# Patient Record
Sex: Female | Born: 1980 | Race: White | Hispanic: No | Marital: Single | Smoking: Never smoker
Health system: Southern US, Community
[De-identification: ages and names within clinical notes are randomized; demographics above are authoritative.]

## PROBLEM LIST (undated history)

## (undated) DIAGNOSIS — M069 Rheumatoid arthritis, unspecified: Secondary | ICD-10-CM

## (undated) HISTORY — PX: DILATION AND CURETTAGE OF UTERUS: SHX78

## (undated) HISTORY — PX: HAND SURGERY: SHX662

## (undated) HISTORY — DX: Rheumatoid arthritis, unspecified: M06.9

---

## 2007-01-11 ENCOUNTER — Inpatient Hospital Stay: Payer: Self-pay | Admitting: Obstetrics and Gynecology

## 2008-08-18 ENCOUNTER — Ambulatory Visit: Payer: Self-pay

## 2010-03-04 ENCOUNTER — Observation Stay: Payer: Self-pay

## 2010-05-15 ENCOUNTER — Inpatient Hospital Stay: Payer: Self-pay

## 2011-12-27 HISTORY — PX: ANKLE FRACTURE SURGERY: SHX122

## 2012-12-22 ENCOUNTER — Ambulatory Visit: Payer: Self-pay | Admitting: Orthopedic Surgery

## 2012-12-22 LAB — CBC WITH DIFFERENTIAL/PLATELET
Basophil #: 0.1 10*3/uL (ref 0.0–0.1)
Basophil %: 0.6 %
HGB: 12.4 g/dL (ref 12.0–16.0)
Lymphocyte %: 14.2 %
MCHC: 32.7 g/dL (ref 32.0–36.0)
Monocyte %: 7.1 %
Neutrophil #: 7.1 10*3/uL — ABNORMAL HIGH (ref 1.4–6.5)
Platelet: 176 10*3/uL (ref 150–440)
RBC: 4.32 10*6/uL (ref 3.80–5.20)
RDW: 14.6 % — ABNORMAL HIGH (ref 11.5–14.5)

## 2012-12-22 LAB — COMPREHENSIVE METABOLIC PANEL
Alkaline Phosphatase: 70 U/L (ref 50–136)
Calcium, Total: 8.5 mg/dL (ref 8.5–10.1)
Chloride: 108 mmol/L — ABNORMAL HIGH (ref 98–107)
Co2: 21 mmol/L (ref 21–32)
Creatinine: 0.91 mg/dL (ref 0.60–1.30)
EGFR (Non-African Amer.): >60
Osmolality: 276 (ref 275–301)
SGOT(AST): 27 U/L (ref 15–37)
SGPT (ALT): 22 U/L (ref 12–78)
Sodium: 138 mmol/L (ref 136–145)

## 2014-08-04 IMAGING — XA DG C-ARM 1-60 MIN
10 series · 10 of 10 positions shown · non-contrast
Comparison: none

REASON FOR EXAM: ORIF right ankle
COMMENTS:

PROCEDURE:     DXR - DXR C-ARM WITH 2 VIEWS RT ANKLE  - December 22, 2012  [DATE]
RESULT:     Patient is status post open reduction internal fixation of
bimalleolar fracture with near-anatomic alignment.

[Series 1: cont. · 1 of 1 slices shown (1 of 9)]
[im 1/1]
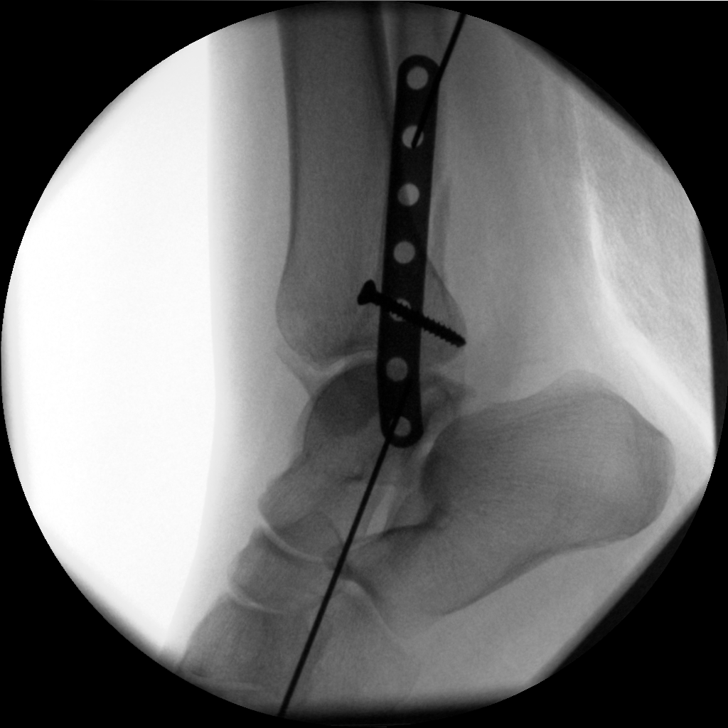

[Series 2: cont. · 1 of 1 slices shown (2 of 9)]
[im 1/1]
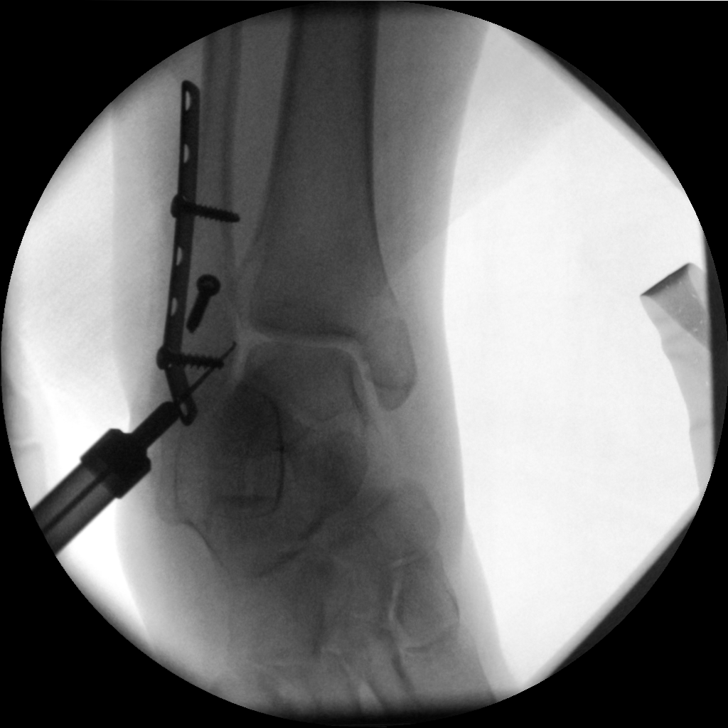

[Series 3: cont. · 1 of 1 slices shown (3 of 9)]
[im 1/1]
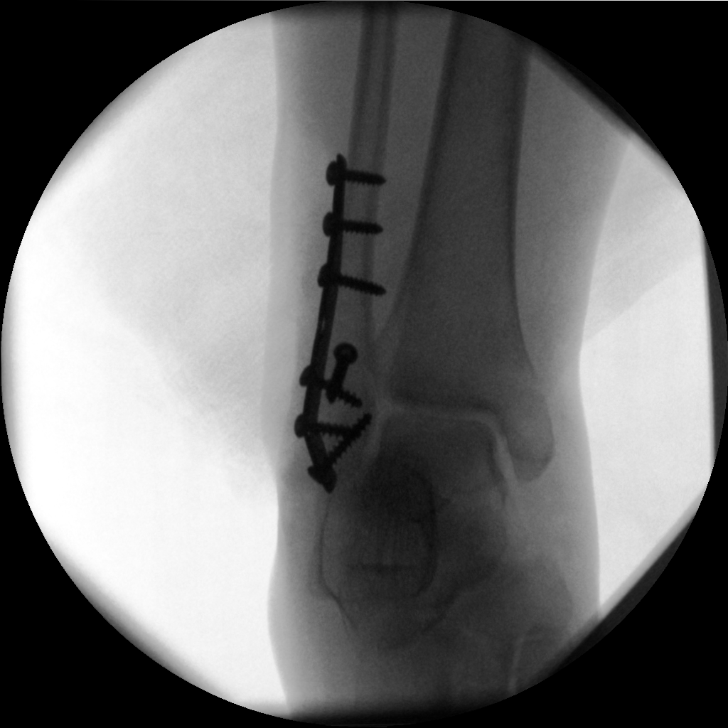

[Series 4: cont. · 1 of 1 slices shown (4 of 9)]
[im 1/1]
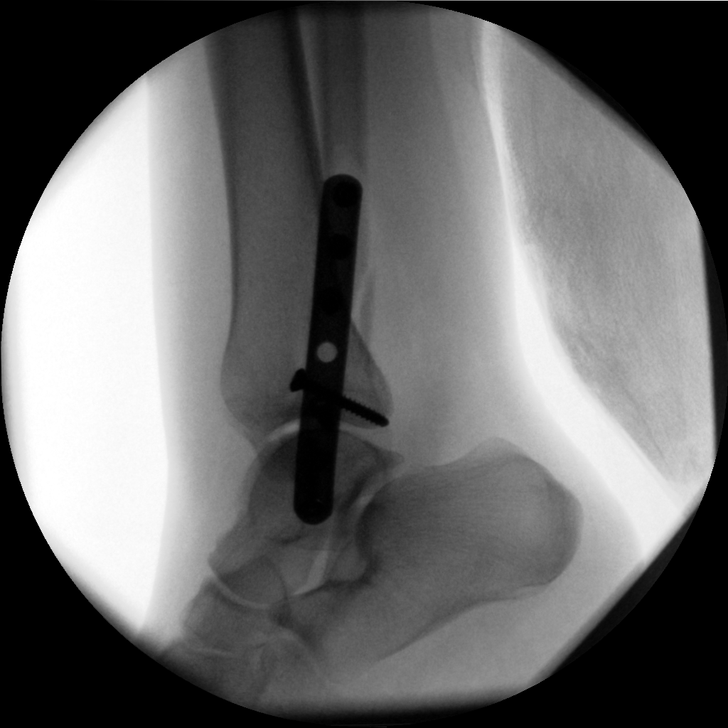

[Series 5: cont. · 1 of 1 slices shown (5 of 9)]
[im 1/1]
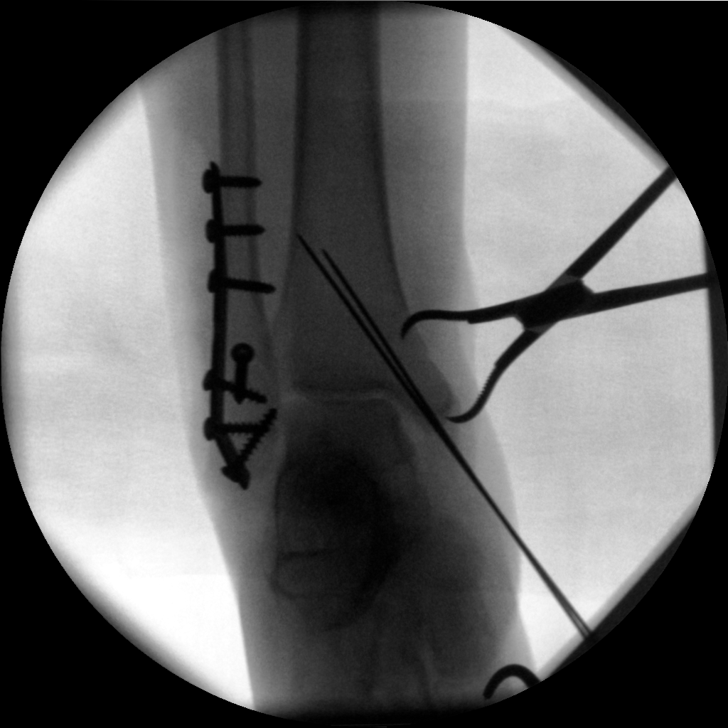

[Series 6: cont. · 1 of 1 slices shown (6 of 9)]
[im 1/1]
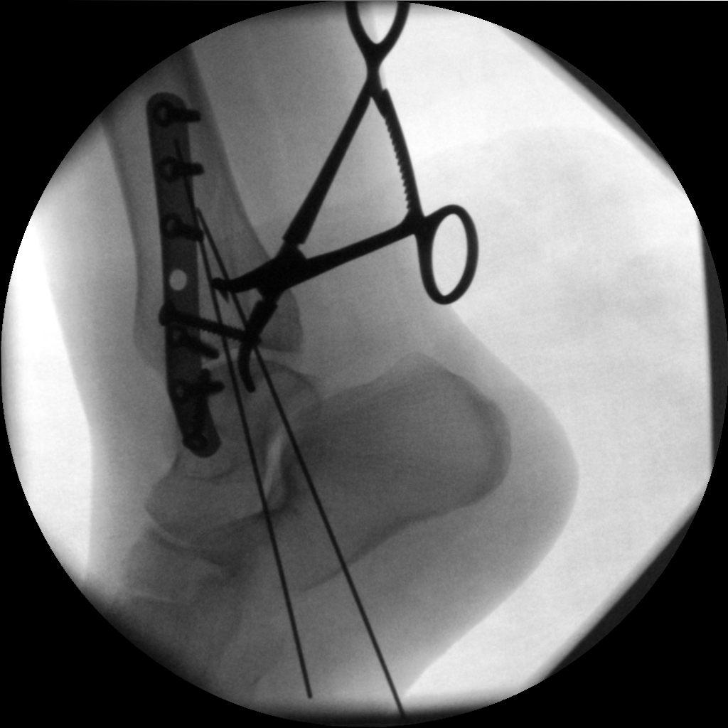

[Series 7: cont. · 1 of 1 slices shown (7 of 9)]
[im 1/1]
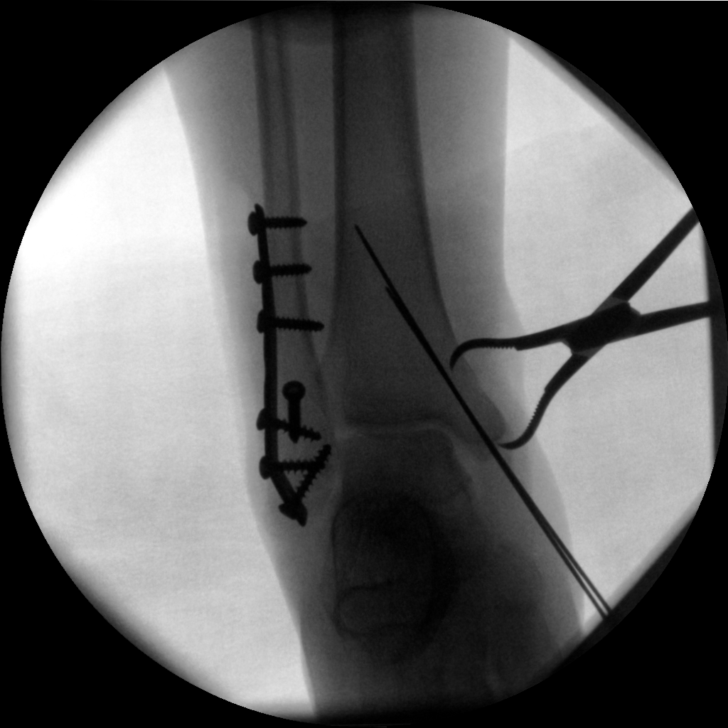

[Series 8: cont. · 1 of 1 slices shown (8 of 9)]
[im 1/1]
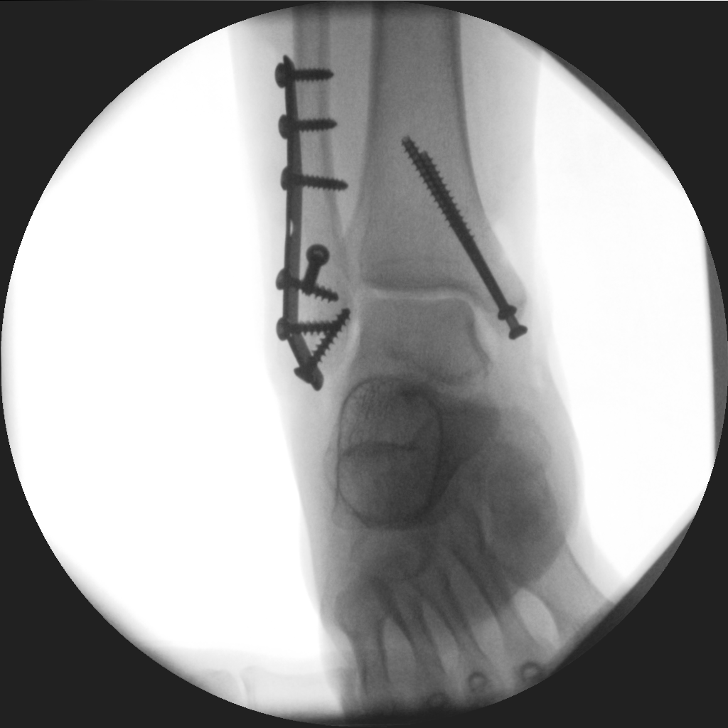

[Series 9: cont. · 1 of 1 slices shown (9 of 9)]
[im 1/1]
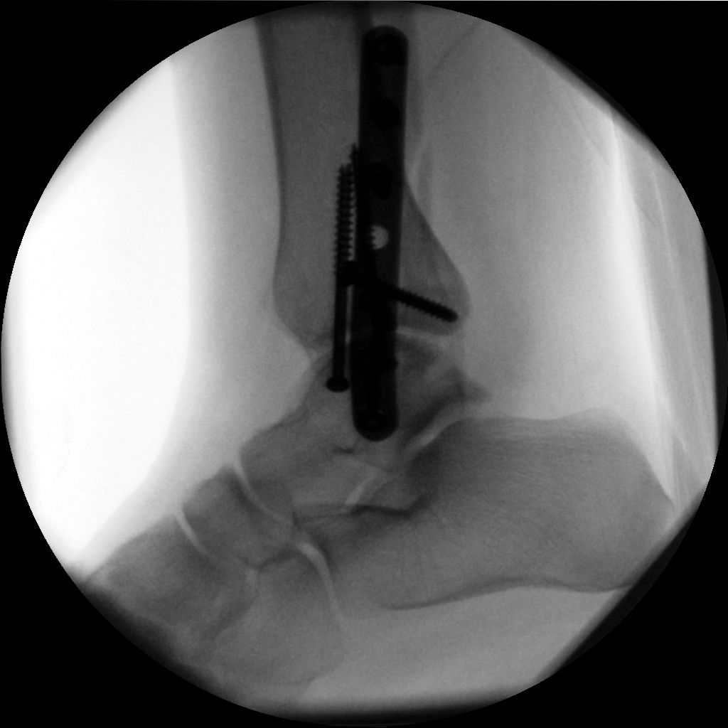

[Series 6001: ds 12-28 · 1 of 1 slices shown]
[im 1/1]
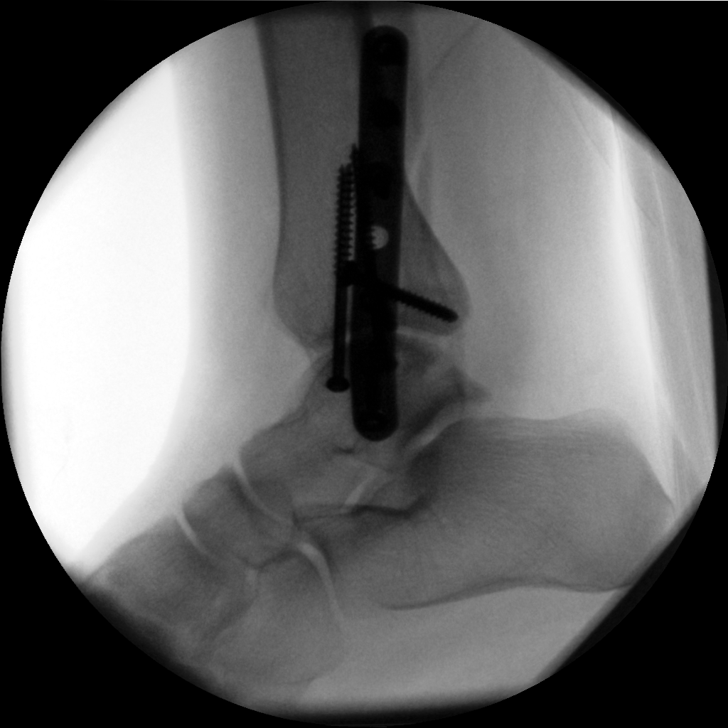

[10 of 10 positions shown; findings below may reference images not displayed]

IMPRESSION: Open reduction internal fixation as described above.

## 2015-04-14 NOTE — Op Note (Signed)
PATIENT NAME:  Natasha Adams, PEPLINSKI MR#:  [id number] DATE OF BIRTH:  March 21, 1981  DATE OF PROCEDURE:  12/22/2012  PREOPERATIVE DIAGNOSIS:  Right bimalleolar ankle fracture.  POSTOPERATIVE DIAGNOSIS:  Right bimalleolar ankle fracture.  PROCEDURE PERFORMED:  Open reduction/internal fixation, right ankle.  SURGEON:  Danelle Earthly, MD.  ANESTHESIA:  General.  FLUIDS ADMINISTERED:  1 liter crystalloid.  ESTIMATED BLOOD LOSS:  25 mL.  PREOPERATIVE ANTIBIOTICS:  1 gram Ancef.  COMPLICATIONS:  None.  DISPOSITION:  Stable to the PACU upon transport.  INDICATIONS FOR PROCEDURE:  This is a 34 year old female who was playing on a scooter with her children earlier today, when she was going down a hill, lost control and fell, sustaining a right ankle fracture.  The patient was counseled on the risks and benefits of the procedure, the risks to include, but not limited to, bleeding, infection, damage to nerves and/or vessels, malunion, nonunion, DVT and possible death.  The patient agreed to proceed with surgery after weighing these risks.  PROCEDURE IN DETAIL:  The patient was brought to the operating room and placed on the operating table with all extremities appropriately padded.  The right lower extremity thigh tourniquet was applied and a hip bump was placed to facilitate internal rotation of the leg.  The right lower extremity was then prepped and draped in sterile fashion.  A timeout was then performed with Anesthesia, nursing staff and surgeon of record to confirm surgical site, patient, medical record, date of birth, procedure to be performed, laterality, administration of antibiotics and confirmation that implants were in the room and available.  Next, a 6 inch Esmarch was used to exsanguinate the lower extremity and the tourniquet was inflated to 250 mmHg.  We began by making a lateral incision overlying the distal fibula, with dissection through hematoma, down to the level of the fibular bone, with  care not to damage the superficial peroneal nerve.  With the fracture site exposed, it was cleaned out with a combination of Freer, dental pick, as well as saline irrigation.  The fracture was reduced with a point-to-point reduction clamp and one 3.5 cortical screw was placed in lag technique to obtain compression across the fracture site.  There was a large thin, posterior cortical spike, but I felt this was too thin and tenuous to place a second lag screw, as the bone was not even strong enough to support a reduction clamp, so the decision was made to go with a single lag screw and then augment with a lateral neutralization plate.  A 7-hole 1/3 tubular plate was chosen and the distal end was bent to be more flush with the distal fibula.  The plate was secured proximally with three bicortical screws and distally with three unicortical cancellous screws.  AP and lateral fluoroscopic imaging was taken to confirm fracture reduction and placement of hardware.    Nest, we turned our attention to the medial malleolar fracture.  An incision was made directly over the medial malleolus, with again meticulous dissection through hematoma, down to the fracture site.  There was some medial cortical comminution noted on the distal medial malleolar fragment.  The fragment was keyed in place and held with a point-to-point reduction clamp.  Next, two threaded guidewires from the 4 mm cannulated screw set were then placed and position was confirmed fluoroscopically.  These then were over-drilled with the 2.7 cannulated drill bit and two partially threaded 50 mm 4.0 cannulated screws were then placed to secure the medial malleolus  fragment.  The guidewire was removed.    Final x-rays were taken to confirm overall fracture reduction and hardware placement.  Both wounds were copiously irrigated with sterile saline and then the tourniquet was deflated at 71 minutes.  After confirmation of maintenance of hemostasis, the wounds were  then sequentially closed with 2-0 Vicryl suture deep, followed by 3-0 Monocryl and interrupted 3-0 nylon sutures in the skin.  The patient was then placed in a sterile dressing, followed by a well-padded splint and transferred to PACU.    POSTOPERATIVE PLAN:  She will be discharged home and will remain strictly non-weightbearing.  She was advised to get up once an hour to go to the bathroom or get a glass of water, so she is at least mobile and decreasing the risk of a blood clot.  She was also counseled regarding signs and symptoms of blood clot, DVTs and the importance of seeing immediate medical attention should she develop significant leg pain, swelling or shortness of breath.  The patient will followup with Dr. Rosita KeaMenz for postoperative followup.  ____________________________ Danelle Earthlyobin T. Eckel, MD tte:eg D: 12/22/2012 20:30:43 ET T: 12/23/2012 20:34:39 ET JOB#: [id number]  cc: Danelle Earthlyobin T. Eckel, MD, <Dictator> Danelle EarthlyBIN T ECKEL MD ELECTRONICALLY SIGNED 12/24/2012 4:47

## 2015-04-14 NOTE — H&P (Signed)
    Subjective/Chief Complaint Right ankle pain    History of Present Illness 34 y/o female who fell this morning and noted immediate pain and inability to bear weight on her right ankle    Past Medical Health Non-Contributory   ALLERGIES:  NKA: None  Sulfa drugs: Unknown  Family and Social History:   Family History Non-Contributory    Social History negative tobacco, negative Illicit drugs    Place of Living Home   Review of Systems:   Subjective/Chief Complaint ankle pain    Fever/Chills No    Cough No    Sputum No    Abdominal Pain No    Diarrhea No    Constipation No    SOB/DOE No    Chest Pain No    Telemetry Reviewed NSR    Medications/Allergies Reviewed Medications/Allergies reviewed   Physical Exam:   GEN well developed, well nourished, no acute distress    NECK supple    RESP normal resp effort    CARD regular rate    EXTR Splint to RLE    NEURO motor/sensory function intact    PSYCH A+O to time, place, person     Assessment/Admission Diagnosis Bimalleolar ankle fracture    Plan ORIF Right ankle   Electronic Signatures: Danelle EarthlyEckel, Tobin T (MD)  (Signed 28-Dec-13 14:04)  Authored: CHIEF COMPLAINT and HISTORY, ALLERGIES, FAMILY AND SOCIAL HISTORY, REVIEW OF SYSTEMS, PHYSICAL EXAM, ASSESSMENT AND PLAN   Last Updated: 28-Dec-13 14:04 by Danelle EarthlyEckel, Tobin T (MD)

## 2015-04-14 NOTE — Consult Note (Signed)
PATIENT NAME:  Natasha Adams, Natasha Adams MR#:  [id number] DATE OF BIRTH:  Dec 20, 1981  DATE OF CONSULTATION:  12/22/2012  CONSULTING PHYSICIAN:  Danelle Earthlyobin T. Eckel, MD  HISTORY OF PRESENT ILLNESS: Natasha Adams is a 34 year old female who was playing on her child's scooter earlier today, was going down a hill, when she fell off and sustained an injury to her right ankle with immediate pain, swelling, and inability to bear weight.  She presented to the ER, where x-rays confirmed unstable bimalleolar ankle fracture.    PAST MEDICAL HISTORY: Significant for syndactyly, as well as Dupuytren's contractures.  Other medical history is noncontributory.    CURRENT MEDICATIONS: None.   ALLERGIES: To Sulfa drugs.    REVIEW OF SYSTEMS: Was negative for fevers, chills, headaches, chest pain, shortness of breath, nausea, vomiting, diarrhea, or constipation.    PHYSICAL EXAMINATION:  GENERAL: She is alert and oriented and in mild distress from her ankle pain.  MUSCULOSKELETAL: She has no pain about her hip or her knee.  She is able to move her toes and has sensation intact distally, as well as brisk capillary refill to all the digits on the right foot.  She does have a splint in place and her leg is elevated on a stack of bedsheets.  She has no tenderness to palpation about her left lower extremity or either upper extremity.    RADIOGRAPHIC STUDIES: Radiographs taken demonstrate bimalleolar ankle fracture.    ASSESSMENT: A 34 year old otherwise healthy female with an unstable ankle fracture.   PLAN: The patient will proceed to the operating room for open reduction internal fixation of her ankle fracture.  She will be placed in a well-padded splint and will be discharged home nonweightbearing.  She will be given pain medication, as well as medicine for constipation and nausea as well to go home with.  She will follow up with Dr. Rosita KeaMenz next week for routine postop check.    ____________________________ Danelle Earthlyobin T. Eckel,  MD tte:th D: 12/22/2012 14:18:05 ET T: 12/23/2012 16:59:21 ET JOB#: [id number]  cc: Danelle Earthlyobin T. Eckel, MD, <Dictator> Danelle EarthlyBIN T ECKEL MD ELECTRONICALLY SIGNED 12/23/2012 17:46

## 2015-11-11 ENCOUNTER — Other Ambulatory Visit (INDEPENDENT_AMBULATORY_CARE_PROVIDER_SITE_OTHER): Payer: BLUE CROSS/BLUE SHIELD | Admitting: *Deleted

## 2015-11-11 DIAGNOSIS — N912 Amenorrhea, unspecified: Secondary | ICD-10-CM

## 2015-11-11 DIAGNOSIS — Z3201 Encounter for pregnancy test, result positive: Secondary | ICD-10-CM

## 2015-11-11 LAB — POCT URINE PREGNANCY: Preg Test, Ur: POSITIVE — AB

## 2015-11-11 NOTE — Progress Notes (Signed)
Patient here today for a confirmation of pregnancy.  UPT positive today in office.  Bedside ultrasound today in office measures 4970w2d sac and yolk sac, no fetal pole.  This pregnancy is an unplanned pregnancy and patient is going to decide whether she is going to keep pregnancy or not.   Patient may return in 2-3 weeks when she decides what she is going to do.

## 2015-11-16 ENCOUNTER — Other Ambulatory Visit (INDEPENDENT_AMBULATORY_CARE_PROVIDER_SITE_OTHER): Payer: BLUE CROSS/BLUE SHIELD | Admitting: *Deleted

## 2015-11-16 ENCOUNTER — Telehealth: Payer: Self-pay | Admitting: *Deleted

## 2015-11-16 DIAGNOSIS — O26851 Spotting complicating pregnancy, first trimester: Secondary | ICD-10-CM

## 2015-11-16 DIAGNOSIS — O209 Hemorrhage in early pregnancy, unspecified: Secondary | ICD-10-CM

## 2015-11-16 NOTE — Telephone Encounter (Signed)
Pt is early pregnancy, approximately 6 wks, stated that she has started bleeding.  Will have pt come to office for BHCG.

## 2015-11-17 LAB — HCG, QUANTITATIVE, PREGNANCY: hCG, Beta Chain, Quant, S: 36773.9 m[IU]/mL — ABNORMAL HIGH

## 2015-11-18 ENCOUNTER — Other Ambulatory Visit (INDEPENDENT_AMBULATORY_CARE_PROVIDER_SITE_OTHER): Payer: BLUE CROSS/BLUE SHIELD | Admitting: *Deleted

## 2015-11-18 DIAGNOSIS — O200 Threatened abortion: Secondary | ICD-10-CM

## 2015-11-19 LAB — HCG, QUANTITATIVE, PREGNANCY: hCG, Beta Chain, Quant, S: 57976.2 m[IU]/mL — ABNORMAL HIGH

## 2015-11-23 ENCOUNTER — Other Ambulatory Visit (INDEPENDENT_AMBULATORY_CARE_PROVIDER_SITE_OTHER): Payer: BLUE CROSS/BLUE SHIELD | Admitting: *Deleted

## 2015-11-23 DIAGNOSIS — Z3201 Encounter for pregnancy test, result positive: Secondary | ICD-10-CM

## 2015-11-23 NOTE — Progress Notes (Signed)
Patient was here today to get an ultrasound for viability.  Pregnancy confirmed and pt is aware.  Ultrasound showed viable fetus with heartbeat.  Patient discharged home.  Patient will make a decision about when to receive care for this pregnancy.

## 2015-11-30 ENCOUNTER — Encounter: Payer: BLUE CROSS/BLUE SHIELD | Admitting: Family Medicine

## 2015-12-27 HISTORY — PX: BREAST ENHANCEMENT SURGERY: SHX7

## 2016-01-25 ENCOUNTER — Other Ambulatory Visit (INDEPENDENT_AMBULATORY_CARE_PROVIDER_SITE_OTHER): Payer: BLUE CROSS/BLUE SHIELD | Admitting: *Deleted

## 2016-01-25 DIAGNOSIS — Z3201 Encounter for pregnancy test, result positive: Secondary | ICD-10-CM

## 2016-01-25 NOTE — Progress Notes (Signed)
Patient here for a beta hcg today.  Took a HPT and got a positive result.

## 2016-01-26 LAB — HCG, QUANTITATIVE, PREGNANCY: HCG, BETA CHAIN, QUANT, S: 42.9 m[IU]/mL — AB

## 2016-01-28 ENCOUNTER — Other Ambulatory Visit (INDEPENDENT_AMBULATORY_CARE_PROVIDER_SITE_OTHER): Payer: BLUE CROSS/BLUE SHIELD | Admitting: *Deleted

## 2016-01-28 DIAGNOSIS — N912 Amenorrhea, unspecified: Secondary | ICD-10-CM

## 2016-01-28 NOTE — Progress Notes (Signed)
Patient here today for a Beta HCG.  

## 2016-01-29 LAB — HCG, QUANTITATIVE, PREGNANCY: hCG, Beta Chain, Quant, S: 31.4 m[IU]/mL — ABNORMAL HIGH

## 2016-02-05 ENCOUNTER — Other Ambulatory Visit (INDEPENDENT_AMBULATORY_CARE_PROVIDER_SITE_OTHER): Payer: BLUE CROSS/BLUE SHIELD | Admitting: *Deleted

## 2016-02-05 DIAGNOSIS — Z3201 Encounter for pregnancy test, result positive: Secondary | ICD-10-CM

## 2016-02-05 LAB — HCG, QUANTITATIVE, PREGNANCY: HCG, BETA CHAIN, QUANT, S: 18.7 m[IU]/mL — AB

## 2016-02-05 NOTE — Progress Notes (Signed)
Patient here today for a beta hcg.

## 2016-02-19 ENCOUNTER — Telehealth: Payer: Self-pay | Admitting: *Deleted

## 2016-02-19 DIAGNOSIS — B379 Candidiasis, unspecified: Secondary | ICD-10-CM

## 2016-02-19 MED ORDER — FLUCONAZOLE 150 MG PO TABS: 150.0000 mg | ORAL_TABLET | Freq: Once | ORAL | Status: DC

## 2016-02-19 NOTE — Telephone Encounter (Signed)
Pt called c/o symptoms persistent with a yeast infection, has tried OTC medication with little relief.  Sent Diflucan to pharmacy per protocol.  Instructed pt on medication use.

## 2016-03-01 ENCOUNTER — Other Ambulatory Visit (INDEPENDENT_AMBULATORY_CARE_PROVIDER_SITE_OTHER): Payer: BLUE CROSS/BLUE SHIELD

## 2016-03-01 DIAGNOSIS — O039 Complete or unspecified spontaneous abortion without complication: Secondary | ICD-10-CM

## 2016-03-01 LAB — HCG, QUANTITATIVE, PREGNANCY: hCG, Beta Chain, Quant, S: <2 m[IU]/mL

## 2016-03-01 NOTE — Progress Notes (Signed)
Patient presents for follow up HCG. Armandina StammerJennifer Cherelle Midkiff RN BSN

## 2016-03-09 ENCOUNTER — Telehealth: Payer: Self-pay | Admitting: *Deleted

## 2016-03-09 NOTE — Telephone Encounter (Signed)
Telephone call to patient regarding hcg results.  Notified patient of level from 03/01/16 .  Informed patient she did not need to return for further blood draws.

## 2016-10-11 ENCOUNTER — Ambulatory Visit (INDEPENDENT_AMBULATORY_CARE_PROVIDER_SITE_OTHER): Payer: BLUE CROSS/BLUE SHIELD | Admitting: Family Medicine

## 2016-10-11 ENCOUNTER — Encounter: Payer: Self-pay | Admitting: Family Medicine

## 2016-10-11 VITALS — BP 101/68 | Resp 18 | Ht 61.5 in | Wt 129.0 lb

## 2016-10-11 DIAGNOSIS — Z3046 Encounter for surveillance of implantable subdermal contraceptive: Secondary | ICD-10-CM | POA: Diagnosis not present

## 2016-10-11 DIAGNOSIS — Z30430 Encounter for insertion of intrauterine contraceptive device: Secondary | ICD-10-CM | POA: Diagnosis not present

## 2016-10-11 MED ORDER — LEVONORGESTREL 20 MCG/24HR IU IUD
INTRAUTERINE_SYSTEM | Freq: Once | INTRAUTERINE | Status: AC
  Administered 2016-10-11: 16:00:00 via INTRAUTERINE

## 2016-10-11 NOTE — Progress Notes (Signed)
   Subjective:    Patient ID: Natasha Adams is a 35 y.o. female presenting with No chief complaint on file.  on 10/11/2016  HPI: Has had Nexplanon for 1 year. Continues to have abnormal bleeding. Would like to have it removed and IUD placed for contraception.  Review of Systems  Skin: Negative for rash.      Objective:    There were no vitals taken for this visit. Physical Exam  Constitutional: She appears well-developed and well-nourished. No distress.  HENT:  Head: Normocephalic and atraumatic.  Neck: Neck supple.  Pulmonary/Chest: Effort normal.   Procedure: Patient given informed consent for removal of her Nexplanon, time out was performed.  Signed copy in the chart.  Appropriate time out taken. Nexplanon site identified.  Area prepped in usual sterile fashon. One cc of 1% lidocaine was used to anesthetize the area at the distal end of the implant. A small stab incision was made right beside the implant on the distal portion.  The Nexplanon rod was grasped using hemostats and removed without difficulty.  There was less than 3 cc blood loss. There were no complications.  A small amount of antibiotic ointment and steri-strips were applied over the small incision.  A pressure bandage was applied to reduce any bruising.  The patient tolerated the procedure well and was given post procedure instructions. Patient identified, informed consent performed, signed copy in chart, time out was performed.  Urine pregnancy test negative.  Procedure: Speculum placed in the vagina.  Cervix visualized.  Cleaned with Betadine x 2.  Grasped anteriourly with a single tooth tenaculum.  Uterus sounded to 6 cm.  Mirena IUD placed per manufacturer's recommendations.  Strings trimmed to 3 cm.   Patient given post procedure instructions and Mirena care card with expiration date.  Patient is asked to check IUD strings periodically and follow up in 4-6 weeks for IUD check.      Assessment & Plan:    Encounter for IUD insertion  Nexplanon removal   Return in about 4 weeks (around 11/08/2016) for iud check.  Reva Boresanya S Giovonnie Trettel 10/11/2016 3:20 PM

## 2016-10-11 NOTE — Patient Instructions (Signed)
Levonorgestrel intrauterine device (IUD) What is this medicine? LEVONORGESTREL IUD (LEE voe nor jes trel) is a contraceptive (birth control) device. The device is placed inside the uterus by a healthcare professional. It is used to prevent pregnancy and can also be used to treat heavy bleeding that occurs during your period. Depending on the device, it can be used for 3 to 5 years. This medicine may be used for other purposes; ask your health care provider or pharmacist if you have questions. What should I tell my health care provider before I take this medicine? They need to know if you have any of these conditions: -abnormal Pap smear -cancer of the breast, uterus, or cervix -diabetes -endometritis -genital or pelvic infection now or in the past -have more than one sexual partner or your partner has more than one partner -heart disease -history of an ectopic or tubal pregnancy -immune system problems -IUD in place -liver disease or tumor -problems with blood clots or take blood-thinners -use intravenous drugs -uterus of unusual shape -vaginal bleeding that has not been explained -an unusual or allergic reaction to levonorgestrel, other hormones, silicone, or polyethylene, medicines, foods, dyes, or preservatives -pregnant or trying to get pregnant -breast-feeding How should I use this medicine? This device is placed inside the uterus by a health care professional. Talk to your pediatrician regarding the use of this medicine in children. Special care may be needed. Overdosage: If you think you have taken too much of this medicine contact a poison control center or emergency room at once. NOTE: This medicine is only for you. Do not share this medicine with others. What if I miss a dose? This does not apply. What may interact with this medicine? Do not take this medicine with any of the following medications: -amprenavir -bosentan -fosamprenavir This medicine may also interact with  the following medications: -aprepitant -barbiturate medicines for inducing sleep or treating seizures -bexarotene -griseofulvin -medicines to treat seizures like carbamazepine, ethotoin, felbamate, oxcarbazepine, phenytoin, topiramate -modafinil -pioglitazone -rifabutin -rifampin -rifapentine -some medicines to treat HIV infection like atazanavir, indinavir, lopinavir, nelfinavir, tipranavir, ritonavir -St. John's wort -warfarin This list may not describe all possible interactions. Give your health care provider a list of all the medicines, herbs, non-prescription drugs, or dietary supplements you use. Also tell them if you smoke, drink alcohol, or use illegal drugs. Some items may interact with your medicine. What should I watch for while using this medicine? Visit your doctor or health care professional for regular check ups. See your doctor if you or your partner has sexual contact with others, becomes HIV positive, or gets a sexual transmitted disease. This product does not protect you against HIV infection (AIDS) or other sexually transmitted diseases. You can check the placement of the IUD yourself by reaching up to the top of your vagina with clean fingers to feel the threads. Do not pull on the threads. It is a good habit to check placement after each menstrual period. Call your doctor right away if you feel more of the IUD than just the threads or if you cannot feel the threads at all. The IUD may come out by itself. You may become pregnant if the device comes out. If you notice that the IUD has come out use a backup birth control method like condoms and call your health care provider. Using tampons will not change the position of the IUD and are okay to use during your period. What side effects may I notice from receiving this medicine?   Side effects that you should report to your doctor or health care professional as soon as possible: -allergic reactions like skin rash, itching or  hives, swelling of the face, lips, or tongue -fever, flu-like symptoms -genital sores -high blood pressure -no menstrual period for 6 weeks during use -pain, swelling, warmth in the leg -pelvic pain or tenderness -severe or sudden headache -signs of pregnancy -stomach cramping -sudden shortness of breath -trouble with balance, talking, or walking -unusual vaginal bleeding, discharge -yellowing of the eyes or skin Side effects that usually do not require medical attention (report to your doctor or health care professional if they continue or are bothersome): -acne -breast pain -change in sex drive or performance -changes in weight -cramping, dizziness, or faintness while the device is being inserted -headache -irregular menstrual bleeding within first 3 to 6 months of use -nausea This list may not describe all possible side effects. Call your doctor for medical advice about side effects. You may report side effects to FDA at 1-800-FDA-1088. Where should I keep my medicine? This does not apply. NOTE: This sheet is a summary. It may not cover all possible information. If you have questions about this medicine, talk to your doctor, pharmacist, or health care provider.    2016, [employer]. (2012-01-12 13:54:04)  

## 2016-10-17 ENCOUNTER — Encounter: Payer: Self-pay | Admitting: *Deleted

## 2018-04-04 ENCOUNTER — Encounter: Payer: Self-pay | Admitting: Nurse Practitioner

## 2018-04-04 ENCOUNTER — Ambulatory Visit (INDEPENDENT_AMBULATORY_CARE_PROVIDER_SITE_OTHER): Payer: 59 | Admitting: Nurse Practitioner

## 2018-04-04 VITALS — BP 92/65 | HR 82 | Ht 61.5 in | Wt 129.2 lb

## 2018-04-04 DIAGNOSIS — Z113 Encounter for screening for infections with a predominantly sexual mode of transmission: Secondary | ICD-10-CM

## 2018-04-04 DIAGNOSIS — Z23 Encounter for immunization: Secondary | ICD-10-CM | POA: Diagnosis not present

## 2018-04-04 DIAGNOSIS — Z01419 Encounter for gynecological examination (general) (routine) without abnormal findings: Secondary | ICD-10-CM

## 2018-04-04 DIAGNOSIS — N898 Other specified noninflammatory disorders of vagina: Secondary | ICD-10-CM | POA: Diagnosis not present

## 2018-04-04 DIAGNOSIS — Z9882 Breast implant status: Secondary | ICD-10-CM | POA: Insufficient documentation

## 2018-04-04 DIAGNOSIS — Z01411 Encounter for gynecological examination (general) (routine) with abnormal findings: Secondary | ICD-10-CM | POA: Diagnosis not present

## 2018-04-04 DIAGNOSIS — Z30431 Encounter for routine checking of intrauterine contraceptive device: Secondary | ICD-10-CM | POA: Insufficient documentation

## 2018-04-04 HISTORY — DX: Breast implant status: Z98.82

## 2018-04-04 NOTE — Progress Notes (Signed)
GYNECOLOGY ANNUAL PREVENTATIVE CARE ENCOUNTER NOTE  Subjective:   Natasha Adams is a 37 y.o. [phone] female here for a routine annual gynecologic exam.  Current complaints increased discharge and vaginal itching.  Has to wear a panty liner daily.  Has been treated previously for BV but the last time was over a year ago.   Denies abnormal vaginal bleeding, pelvic pain, problems with intercourse or other gynecologic concerns.    Gynecologic History Patient's last menstrual period was 03/19/2018. Contraception: IUD Last Pap: several years ago. Results were: normal Last mammogram: not indicated yet. Had one 5 years ago.  Results were: normal  Obstetric History OB History  Gravida Para Term Preterm AB Living  3 2 2   1 2   SAB TAB Ectopic Multiple Live Births    1     2    # Outcome Date GA Lbr Len/2nd Weight Sex Delivery Anes PTL Lv  3 TAB 2017 4134w0d         2 Term 05/16/10 1278w0d  6 lb 12 oz (3.062 kg) F    LIV  1 Term 01/12/07 6178w0d  5 lb 4 oz (2.381 kg) F Vag-Spont   LIV    Past Medical History:  Diagnosis Date  . Rheumatoid arthritis [facility](HCC)     Past Surgical History:  Procedure Laterality Date  . ANKLE FRACTURE SURGERY Right 2013  . BREAST ENHANCEMENT SURGERY  2017  . DILATION AND CURETTAGE OF UTERUS    . HAND SURGERY      Current Outpatient Medications on File Prior to Visit  Medication Sig Dispense Refill  . levonorgestrel (MIRENA, 52 MG,) 20 MCG/24HR IUD by Intrauterine route.     No current facility-administered medications on file prior to visit.     Allergies  Allergen Reactions  . Ibuprofen Other (See Comments)    Exacerbates stomach ulcers.   . Sulfa Antibiotics Other (See Comments)    Rheumatoid arthritis    Social History   Socioeconomic History  . Marital status: Married    Spouse name: Not on file  . Number of children: Not on file  . Years of education: Not on file  . Highest education level: Not on file  Occupational History  . Not on file   Social Needs  . Financial resource strain: Not on file  . Food insecurity:    Worry: Not on file    Inability: Not on file  . Transportation needs:    Medical: Not on file    Non-medical: Not on file  Tobacco Use  . Smoking status: Never Smoker  . Smokeless tobacco: Never Used  Substance and Sexual Activity  . Alcohol use: Yes    Comment: weekends  . Drug use: No  . Sexual activity: Yes    Partners: Male    Birth control/protection: IUD  Lifestyle  . Physical activity:    Days per week: Not on file    Minutes per session: Not on file  . Stress: Not on file  Relationships  . Social connections:    Talks on phone: Not on file    Gets together: Not on file    Attends religious service: Not on file    Active member of club or organization: Not on file    Attends meetings of clubs or organizations: Not on file    Relationship status: Not on file  . Intimate partner violence:    Fear of current or ex partner: Not on file  Emotionally abused: Not on file    Physically abused: Not on file    Forced sexual activity: Not on file  Other Topics Concern  . Not on file  Social History Narrative  . Not on file    History reviewed. No pertinent family history.  The following portions of the patient's history were reviewed and updated as appropriate: allergies, current medications, past family history, past medical history, past social history, past surgical history and problem list.  Review of Systems Pertinent items noted in HPI and remainder of comprehensive ROS otherwise negative.   Objective:  BP 92/65   Pulse 82   Ht 5' 1.5" (1.562 m)   Wt 129 lb 3.2 oz (58.6 kg)   LMP 03/19/2018   BMI 24.02 kg/m  CONSTITUTIONAL: Well-developed, well-nourished female in no acute distress.  HENT:  Normocephalic, atraumatic, External right and left ear normal.  EYES: Conjunctivae and EOM are normal.No scleral icterus.  NECK: Normal range of motion, supple, no masses.  Normal  thyroid.  SKIN: Skin is warm and dry. No rash noted. Not diaphoretic. No erythema. No pallor. NEUROLOGIC: Alert and oriented to person, place, and time. Normal muscle tone coordination. No cranial nerve deficit noted. PSYCHIATRIC: Normal mood and affect. Normal behavior. Normal judgment and thought content. CARDIOVASCULAR: Normal heart rate noted, regular rhythm RESPIRATORY: Clear to auscultation bilaterally. Effort and breath sounds normal, no problems with respiration noted. BREASTS: Symmetric in size. Scars consistent with bilateral breast implants noted.  Implants palpated.  No masses, skin changes, nipple drainage, or lymphadenopathy. ABDOMEN: Soft, no distention noted.  No tenderness, rebound or guarding.  PELVIC: Normal appearing external genitalia; normal appearing vaginal mucosa and cervix.  Abnormal discharge noted - pooling in vagina - yellow cloudy no odor detected.  Pap smear obtained.  IUD strings seen and no part of IUD palpated on bimanual exam.   Normal uterine size, no other palpable masses, no uterine or adnexal tenderness.   MUSCULOSKELETAL: Normal range of motion. No tenderness.  No cyanosis, clubbing, or edema.     Assessment and Plan:  1. Vaginal discharge Increased amount noted on pelvic exam.  Testing being done today - has been ongoing - wears a minipad daily.  Will change and not use the Always brand as that brand may contribute to the itching. - Cervicovaginal ancillary only  2. Well woman exam  - Cytology - PAP - Tdap vaccine greater than or equal to 7yo IM -  given  3. Screening examination for STD (sexually transmitted disease)  - Hepatitis B surface antigen - Hepatitis C antibody - HIV antibody - RPR  4. Encounter for well woman exam with routine gynecological exam   5. Contraceptive, surveillance, intrauterine device IUD strings visualized and no part of IUD is not felt on bimanual exam Is pleased with IUD and will continue using   6. Encounter for  immunization  - Tdap vaccine greater than or equal to 7yo IM  Will follow up results of pap smear and manage accordingly. Mammogram not indicated - had one at age 58 and was to follow up again at age 22. Routine preventative health maintenance measures emphasized. Please refer to After Visit Summary for other counseling recommendations.    Nolene Bernheim, RN, MSN, NP-BC Nurse Practitioner, [facility] for [facility]

## 2018-04-05 LAB — RPR: RPR: NONREACTIVE

## 2018-04-05 LAB — HIV ANTIBODY (ROUTINE TESTING W REFLEX): HIV Screen 4th Generation wRfx: NONREACTIVE

## 2018-04-05 LAB — HEPATITIS C ANTIBODY: Hep C Virus Ab: <0.1 s/co ratio (ref 0.0–0.9)

## 2018-04-05 LAB — CERVICOVAGINAL ANCILLARY ONLY
Bacterial vaginitis: POSITIVE — AB
Candida vaginitis: NEGATIVE

## 2018-04-05 LAB — HEPATITIS B SURFACE ANTIGEN: Hepatitis B Surface Ag: NEGATIVE

## 2018-04-06 ENCOUNTER — Encounter (INDEPENDENT_AMBULATORY_CARE_PROVIDER_SITE_OTHER): Payer: Self-pay

## 2018-04-06 ENCOUNTER — Other Ambulatory Visit: Payer: Self-pay | Admitting: Nurse Practitioner

## 2018-04-06 LAB — CYTOLOGY - PAP
DIAGNOSIS: NEGATIVE
HPV: NOT DETECTED

## 2018-04-06 MED ORDER — FLUCONAZOLE 150 MG PO TABS: 150.0000 mg | ORAL_TABLET | Freq: Every day | ORAL | 0 refills | Status: DC

## 2018-04-06 MED ORDER — TINIDAZOLE 500 MG PO TABS: 2.0000 g | ORAL_TABLET | Freq: Every day | ORAL | 0 refills | Status: AC

## 2018-04-06 NOTE — Progress Notes (Signed)
Lab results reviewed.  Bacterial vaginosis present and consistent with physical exam findings.  Sent MyChart note to client.  Prescribed Tinidazole 2 gm daily x 2 days.  Client did not think that medtronidazole worked for her in the past and sent one diflucan tablet as she stated that she often gets a yeast infection afterwards.  Natasha BernheimERRI Ladonna Vanorder, RN, MSN, NP-BC Nurse Practitioner, [facility], [facility] 04/06/2018 12:47 PM

## 2018-05-28 ENCOUNTER — Encounter: Payer: Self-pay | Admitting: Nurse Practitioner

## 2019-12-05 ENCOUNTER — Other Ambulatory Visit: Payer: Self-pay

## 2019-12-05 DIAGNOSIS — Z20822 Contact with and (suspected) exposure to covid-19: Secondary | ICD-10-CM

## 2019-12-07 LAB — NOVEL CORONAVIRUS, NAA: SARS-CoV-2, NAA: NOT DETECTED

## 2019-12-11 ENCOUNTER — Other Ambulatory Visit: Payer: Self-pay

## 2019-12-11 ENCOUNTER — Ambulatory Visit: Payer: 59 | Attending: Internal Medicine

## 2019-12-11 DIAGNOSIS — Z20822 Contact with and (suspected) exposure to covid-19: Secondary | ICD-10-CM

## 2019-12-13 LAB — NOVEL CORONAVIRUS, NAA: SARS-CoV-2, NAA: NOT DETECTED

## 2019-12-16 ENCOUNTER — Ambulatory Visit: Payer: 59 | Attending: Internal Medicine

## 2019-12-16 DIAGNOSIS — Z20822 Contact with and (suspected) exposure to covid-19: Secondary | ICD-10-CM

## 2019-12-17 LAB — NOVEL CORONAVIRUS, NAA: SARS-CoV-2, NAA: NOT DETECTED

## 2019-12-18 ENCOUNTER — Other Ambulatory Visit: Payer: 59

## 2020-06-24 ENCOUNTER — Other Ambulatory Visit (HOSPITAL_COMMUNITY)
Admission: RE | Admit: 2020-06-24 | Discharge: 2020-06-24 | Disposition: A | Discharge status: Home / Self Care | Payer: 59 | Source: Ambulatory Visit | Attending: Obstetrics & Gynecology | Admitting: Obstetrics & Gynecology

## 2020-06-24 ENCOUNTER — Other Ambulatory Visit: Payer: Self-pay

## 2020-06-24 ENCOUNTER — Ambulatory Visit (INDEPENDENT_AMBULATORY_CARE_PROVIDER_SITE_OTHER): Payer: 59

## 2020-06-24 VITALS — BP 111/76 | HR 82

## 2020-06-24 DIAGNOSIS — N898 Other specified noninflammatory disorders of vagina: Secondary | ICD-10-CM | POA: Diagnosis present

## 2020-06-24 NOTE — Progress Notes (Signed)
SUBJECTIVE:  39 y.o. female complains of  vaginal discharge for a couple of days. Denies abnormal vaginal bleeding or significant pelvic pain or fever. No UTI symptoms. Denies history of known exposure to STD.  No LMP recorded. (Menstrual status: IUD).  OBJECTIVE:  She appears well, afebrile. Urine dipstick: not done at this time  ASSESSMENT:  Vaginal Discharge small amount  Vaginal Odor: no    PLAN:   BVAG, CVAG probe sent to lab. Treatment: To be determined once lab results are received ROV prn if symptoms persist or worsen.

## 2020-06-24 NOTE — Progress Notes (Signed)
Patient was assessed and managed by nursing staff during this encounter. I have reviewed the chart and agree with the documentation and plan. I have also made any necessary editorial changes.  Jaynie Collins, MD 06/24/2020 8:27 AM

## 2020-06-24 NOTE — Progress Notes (Signed)
SUBJECTIVE:  39 y.o. female complains of vaginal discharge for a couple of days. Denies abnormal vaginal bleeding or significant pelvic pain or fever. No UTI symptoms. Denies history of known exposure to STD.  No LMP recorded. (Menstrual status: IUD).  OBJECTIVE:  She appears well, afebrile. Urine dipstick: not done in the office   ASSESSMENT:  Vaginal Discharge: small amount  Vaginal Odor; none   PLAN:   BVAG, CVAG probe sent to lab. Treatment: To be determined once lab results are received ROV prn if symptoms persist or worsen.

## 2020-06-25 ENCOUNTER — Other Ambulatory Visit: Payer: Self-pay | Admitting: Obstetrics & Gynecology

## 2020-06-25 ENCOUNTER — Telehealth: Payer: Self-pay

## 2020-06-25 DIAGNOSIS — N760 Acute vaginitis: Secondary | ICD-10-CM

## 2020-06-25 DIAGNOSIS — B9689 Other specified bacterial agents as the cause of diseases classified elsewhere: Secondary | ICD-10-CM

## 2020-06-25 LAB — CERVICOVAGINAL ANCILLARY ONLY
Bacterial Vaginitis (gardnerella): POSITIVE — AB
Candida Glabrata: NEGATIVE
Candida Vaginitis: NEGATIVE
Comment: NEGATIVE
Comment: NEGATIVE
Comment: NEGATIVE

## 2020-06-25 MED ORDER — METRONIDAZOLE 500 MG PO TABS: 500.0000 mg | ORAL_TABLET | Freq: Two times a day (BID) | ORAL | 0 refills | Status: DC

## 2020-06-25 MED ORDER — METRONIDAZOLE 500 MG PO TABS: 500.0000 mg | ORAL_TABLET | Freq: Two times a day (BID) | ORAL | 0 refills | Status: AC

## 2020-06-25 NOTE — Telephone Encounter (Signed)
Patient would like rx sent to [facility] instead of [facility].

## 2020-07-02 ENCOUNTER — Other Ambulatory Visit: Payer: Self-pay

## 2020-07-02 ENCOUNTER — Encounter: Payer: Self-pay | Admitting: Obstetrics and Gynecology

## 2020-07-02 ENCOUNTER — Ambulatory Visit (INDEPENDENT_AMBULATORY_CARE_PROVIDER_SITE_OTHER): Payer: 59 | Admitting: Obstetrics and Gynecology

## 2020-07-02 VITALS — BP 101/61 | HR 69 | Wt 127.0 lb

## 2020-07-02 DIAGNOSIS — N939 Abnormal uterine and vaginal bleeding, unspecified: Secondary | ICD-10-CM | POA: Diagnosis not present

## 2020-07-02 HISTORY — DX: Abnormal uterine and vaginal bleeding, unspecified: N93.9

## 2020-07-02 MED ORDER — TRANEXAMIC ACID 650 MG PO TABS: 1300.0000 mg | ORAL_TABLET | Freq: Three times a day (TID) | ORAL | 2 refills | Status: DC

## 2020-07-02 NOTE — Progress Notes (Signed)
Had vaginal spotting x 3 weeks. Also had cramping so went to see a provider as was treated for UTI, also had STD screen whch was negative  Does get regular periods with her IUD, she noticed her periods changed after getting the Covid vaccine

## 2020-07-02 NOTE — Progress Notes (Signed)
Obstetrics and Gynecology Visit Return Patient Evaluation  Appointment Date: 07/02/2020  Primary Care Provider: Jerl Mina  OBGYN Clinic: [facility]  Chief Complaint: AUB  History of Present Illness:  Natasha Adams is a 39 y.o. with above CC. PMHx significant for Mirena in place since 2017, normal pap smear 2019 and COVID vaccine series Jsn-Feb 2021.   Patient has had regular, qmonth, 7-8d periods with the Mirena. She had a regular period in May but has had irregular bleeding since then. It's not heavy and usually light and can go from bright red to dark, it is daily and there seems to be some cramping associated with it. She had BV and yeast testing on 6/30 that was + for BV. Exam at IM clinic on 7/2 showed small amount of old blood. No comment on strings   Review of Systems:  as noted in the History of Present Illness. Medications:  Elasia L. Fabel had no medications administered during this visit. Current Outpatient Medications  Medication Sig Dispense Refill  . levonorgestrel (MIRENA, 52 MG,) 20 MCG/24HR IUD by Intrauterine route.     No current facility-administered medications for this visit.    Allergies: is allergic to ibuprofen and sulfa antibiotics.  Physical Exam:  BP 101/61   Pulse 69   Wt 127 lb (57.6 kg)   BMI 23.61 kg/m  Body mass index is 23.61 kg/m. General appearance: Well nourished, well developed female in no acute distress.  Neuro/Psych:  Normal mood and affect.    Pelvic exam:  EGBUS normal Vagina: scant amount of old blood in vault Cervix: normal, nttp, grey IUD strings 3-4cm with strings tucked into fornices  Assessment: AUB with IUD in plac.e pt stable.   Plan: I d/w her that could be malpositioned IUD or from chronic LNG IUD use. Pt leaving the country in five days, so will do u/s to check position. Pt had GI s/s with OCP likely due to estrogen in the past. D/w her re: Lysteda and she is amenable to this.    RTC: PRN  Cornelia Copa MD Attending Center for [employer] [facility])

## 2020-07-07 ENCOUNTER — Telehealth: Payer: Self-pay | Admitting: Obstetrics and Gynecology

## 2020-07-07 ENCOUNTER — Ambulatory Visit: Payer: 59

## 2020-07-07 MED ORDER — NORETHIN ACE-ETH ESTRAD-FE 1-20 MG-MCG PO TABS: 1.0000 | ORAL_TABLET | Freq: Every day | ORAL | 0 refills | Status: DC

## 2020-07-07 NOTE — Telephone Encounter (Signed)
GYN Telephone Note Lysteda doesn't seem to be helping; bleeding is no worse. Pt unable to do u/s due to cost. I told her I would do a month of a EE OCP to see if that can stop her bleeding and she is amenable to this. Today is last day of Lysteda. Pt told to finish it today and start OCP on Thursday. Pt traveling for a week and I told her about VTE risk and to ambulate on flight q42m to decrease risk. If OCP no help, pt to let us know after she finishes out the pack.   Cornelia Copa MD Attending Center for [employer] (Faculty Practice) 07/07/2020 Time: [phone]

## 2020-07-08 ENCOUNTER — Ambulatory Visit: Payer: 59 | Attending: Internal Medicine

## 2020-07-08 DIAGNOSIS — Z20822 Contact with and (suspected) exposure to covid-19: Secondary | ICD-10-CM

## 2020-07-09 ENCOUNTER — Ambulatory Visit: Payer: 59 | Admitting: Obstetrics & Gynecology

## 2020-07-09 LAB — NOVEL CORONAVIRUS, NAA: SARS-CoV-2, NAA: NOT DETECTED

## 2020-07-09 LAB — SARS-COV-2, NAA 2 DAY TAT

## 2020-07-10 ENCOUNTER — Other Ambulatory Visit: Payer: Self-pay | Admitting: Obstetrics and Gynecology

## 2020-07-10 MED ORDER — NORETHIN ACE-ETH ESTRAD-FE 1-20 MG-MCG PO TABS: 1.0000 | ORAL_TABLET | Freq: Every day | ORAL | 0 refills | Status: DC

## 2020-07-10 NOTE — Progress Notes (Signed)
Routing to correct [employer]

## 2020-08-04 ENCOUNTER — Other Ambulatory Visit: Payer: Self-pay | Admitting: Obstetrics and Gynecology

## 2020-08-21 ENCOUNTER — Other Ambulatory Visit: Payer: Self-pay

## 2020-08-21 ENCOUNTER — Other Ambulatory Visit: Payer: 59

## 2020-08-21 DIAGNOSIS — Z20822 Contact with and (suspected) exposure to covid-19: Secondary | ICD-10-CM

## 2020-08-22 LAB — SARS-COV-2, NAA 2 DAY TAT

## 2020-08-22 LAB — NOVEL CORONAVIRUS, NAA: SARS-CoV-2, NAA: NOT DETECTED

## 2020-08-28 ENCOUNTER — Other Ambulatory Visit: Payer: Self-pay | Admitting: Obstetrics and Gynecology

## 2020-08-28 MED ORDER — NORETHIN ACE-ETH ESTRAD-FE 1-20 MG-MCG PO TABS: 1.0000 | ORAL_TABLET | Freq: Every day | ORAL | 0 refills | Status: DC

## 2020-10-09 ENCOUNTER — Encounter: Payer: Self-pay | Admitting: Nurse Practitioner

## 2020-10-09 ENCOUNTER — Other Ambulatory Visit: Payer: Self-pay

## 2020-10-09 ENCOUNTER — Ambulatory Visit (INDEPENDENT_AMBULATORY_CARE_PROVIDER_SITE_OTHER): Payer: 59 | Admitting: Nurse Practitioner

## 2020-10-09 ENCOUNTER — Other Ambulatory Visit (HOSPITAL_COMMUNITY)
Admission: RE | Admit: 2020-10-09 | Discharge: 2020-10-09 | Disposition: A | Discharge status: Home / Self Care | Payer: 59 | Source: Ambulatory Visit | Attending: Obstetrics & Gynecology | Admitting: Obstetrics & Gynecology

## 2020-10-09 VITALS — BP 101/69 | HR 99 | Wt 123.0 lb

## 2020-10-09 DIAGNOSIS — Z30432 Encounter for removal of intrauterine contraceptive device: Secondary | ICD-10-CM | POA: Diagnosis not present

## 2020-10-09 DIAGNOSIS — N760 Acute vaginitis: Secondary | ICD-10-CM | POA: Diagnosis present

## 2020-10-09 MED ORDER — NYSTATIN-TRIAMCINOLONE 100000-0.1 UNIT/GM-% EX CREA: TOPICAL_CREAM | Freq: Two times a day (BID) | CUTANEOUS | Status: DC

## 2020-10-09 MED ORDER — NORETHIN ACE-ETH ESTRAD-FE 1-20 MG-MCG PO TABS: 1.0000 | ORAL_TABLET | Freq: Every day | ORAL | 12 refills | Status: DC

## 2020-10-09 NOTE — Progress Notes (Signed)
Think she had BV  Has irritation in a red line on left labia that comes and goes

## 2020-10-09 NOTE — Patient Instructions (Signed)
Vaginitis  Vaginitis is irritation and swelling (inflammation) of the vagina. It happens when normal bacteria and yeast in the vagina grow too much. There are many types of this condition. Treatment will depend on the type you have. Follow these instructions at home: Lifestyle  Keep your vagina area clean and dry. ? Avoid using soap. ? Rinse the area with water.  Do not do the following until your doctor says it is okay: ? Wash and clean out the vagina (douche). ? Use tampons. ? Have sex.  Wipe from front to back after going to the bathroom.  Let air reach your vagina. ? Wear cotton underwear. ? Do not wear:  Underwear while you sleep.  Tight pants.  Thong underwear.  Underwear or nylons without a cotton panel. ? Take off any wet clothing, such as bathing suits, as soon as possible.  Use gentle, non-scented products. Do not use things that can irritate the vagina, such as fabric softeners. Avoid the following products if they are scented: ? Feminine sprays. ? Detergents. ? Tampons. ? Feminine hygiene products. ? Soaps or bubble baths.  Practice safe sex and use condoms. General instructions  Take over-the-counter and prescription medicines only as told by your doctor.  If you were prescribed an antibiotic medicine, take or use it as told by your doctor. Do not stop taking or using the antibiotic even if you start to feel better.  Keep all follow-up visits as told by your doctor. This is important. Contact a doctor if:  You have pain in your belly.  You have a fever.  Your symptoms last for more than 2-3 days. Get help right away if:  You have a fever and your symptoms get worse all of a sudden. Summary  Vaginitis is irritation and swelling of the vagina. It can happen when the normal bacteria and yeast in the vagina grow too much. There are many types.  Treatment will depend on the type you have.  Do not douche, use tampons , or have sex until your health  care provider approves. When you can return to sex, practice safe sex and use condoms. This information is not intended to replace advice given to you by your health care provider. Make sure you discuss any questions you have with your health care provider. Document Revised: 11/24/2017 Document Reviewed: 01/03/2017 Elsevier Patient Education  2020 [employer].  Oral Contraception Use Oral contraceptive pills (OCPs) are medicines that you take to prevent pregnancy. OCPs work by:  Preventing the ovaries from releasing eggs.  Thickening mucus in the lower part of the uterus (cervix), which prevents sperm from entering the uterus.  Thinning the lining of the uterus (endometrium), which prevents a fertilized egg from attaching to the endometrium. OCPs are highly effective when taken exactly as prescribed. However, OCPs do not prevent sexually transmitted infections (STIs). Safe sex practices, such as using condoms while on an OCP, can help prevent STIs. Before taking OCPs, you may have a physical exam, blood test, and Pap test. A Pap test involves taking a sample of cells from your cervix to check for cancer. Discuss with your health care provider the possible side effects of the OCP you may be prescribed. When you start an OCP, be aware that it can take 2-3 months for your body to adjust to changes in hormone levels. How to take oral contraceptive pills Follow instructions from your health care provider about how to start taking your first cycle of OCPs. Your health care  provider may recommend that you:  Start the pill on day 1 of your menstrual period. If you start at this time, you will not need any backup form of birth control (contraception), such as condoms.  Start the pill on the first Sunday after your menstrual period or on the day you get your prescription. In these cases, you will need to use backup contraception for the first week.  Start the pill at any time of your cycle. ? If you  take the pill within 5 days of the start of your period, you will not need a backup form of contraception. ? If you start at any other time of your menstrual cycle, you will need to use another form of contraception for 7 days. If your OCP is the type called a minipill, it will protect you from pregnancy after taking it for 2 days (48 hours), and you can stop using backup contraception after that time. After you have started taking OCPs:  If you forget to take 1 pill, take it as soon as you remember. Take the next pill at the regular time.  If you miss 2 or more pills, call your health care provider. Different pills have different instructions for missed doses. Use backup birth control until your next menstrual period starts.  If you use a 28-day pack that contains inactive pills and you miss 1 of the last 7 pills (pills with no hormones), throw away the rest of the non-hormone pills and start a new pill pack. No matter which day you start the OCP, you will always start a new pack on that same day of the week. Have an extra pack of OCPs and a backup contraceptive method available in case you miss some pills or lose your OCP pack. Follow these instructions at home:  Do not use any products that contain nicotine or tobacco, such as cigarettes and e-cigarettes. If you need help quitting, ask your health care provider.  Always use a condom to protect against STIs. OCPs do not protect against STIs.  Use a calendar to mark the days of your menstrual period.  Read the information and directions that came with your OCP. Talk to your health care provider if you have questions. Contact a health care provider if:  You develop nausea and vomiting.  You have abnormal vaginal discharge or bleeding.  You develop a rash.  You miss your menstrual period. Depending on the type of OCP you are taking, this may be a sign of pregnancy. Ask your health care provider for more information.  You are losing your  hair.  You need treatment for mood swings or depression.  You get dizzy when taking the OCP.  You develop acne after taking the OCP.  You become pregnant or think you may be pregnant.  You have diarrhea, constipation, and abdominal pain or cramps.  You miss 2 or more pills. Get help right away if:  You develop chest pain.  You develop shortness of breath.  You have an uncontrolled or severe headache.  You develop numbness or slurred speech.  You develop visual or speech problems.  You develop pain, redness, and swelling in your legs.  You develop weakness or numbness in your arms or legs. Summary  Oral contraceptive pills (OCPs) are medicines that you take to prevent pregnancy.  OCPs do not prevent sexually transmitted infections (STIs). Always use a condom to protect against STIs.  When you start an OCP, be aware that it can take  2-3 months for your body to adjust to changes in hormone levels.  Read all the information and directions that come with your OCP. This information is not intended to replace advice given to you by your health care provider. Make sure you discuss any questions you have with your health care provider. Document Revised: 04/05/2019 Document Reviewed: 01/23/2017 Elsevier Patient Education  2020 [employer].

## 2020-10-09 NOTE — Progress Notes (Signed)
Subjective:     Patient ID: Natasha Adams, female   DOB: 11-25-1981, 39 y.o.   MRN: [id number]  HPI  Natasha Adams is a 39 y.o. [id number], currently not pregnant who presents to the [facility] with c/o vaginal irritation and a red line on left labia that comes and goes. Most recently over the past few days. Vaginal burning and itching past 4 days. "Feels like BV". Has had irregular periods and currently using IUD and OC's. Would like to have IUD removed.  Also wants form completed for job. She is a Surveyor, mining.   Review of Systems  Genitourinary: Positive for menstrual problem, vaginal discharge and vaginal pain.  Skin: Positive for rash.  All other systems reviewed and are negative.      Objective: BP 101/69   Pulse 99   Wt 123 lb (55.8 kg)   BMI 22.86 kg/m     Physical Exam Vitals and nursing note reviewed. Exam conducted with a chaperone present.  Constitutional:      General: She is not in acute distress.    Appearance: Normal appearance. She is normal weight.  HENT:     Head: Normocephalic.  Eyes:     Conjunctiva/sclera: Conjunctivae normal.  Cardiovascular:     Rate and Rhythm: Normal rate and regular rhythm.  Pulmonary:     Effort: Pulmonary effort is normal.     Breath sounds: Normal breath sounds.  Abdominal:     General: Abdomen is flat. Bowel sounds are normal.     Tenderness: There is abdominal tenderness. There is no right CVA tenderness or left CVA tenderness.  Genitourinary:    Labia:        Left: Tenderness (mild) present.        Comments: Left labia with area of erythema. No bleeding or drainage from the site. Thick white d/c vaginal vault, cervix without lesions, no bleeding noted. IUD string visible. No CMT, no adnexal tenderness, no mass palpated, uterus normal size.  Musculoskeletal:        General: Normal range of motion.     Cervical back: Normal range of motion.  Skin:    General: Skin is warm and dry.  Neurological:     General: No  focal deficit present.     Mental Status: She is alert.  Psychiatric:        Mood and Affect: Mood normal.        Behavior: Behavior normal.   IUD Removal  Client identified, informed consent performed, consent signed.  Patient was in the dorsal lithotomy position, normal external genitalia was noted.  A speculum was placed in the patient's vagina, normal white d/c was noted.. The cervix was visualized, no lesions. The strings of the IUD were grasped and pulled using ring forceps. The Mirena IUD was removed in its entirety. Intact device shown to client. Client tolerated the procedure well.    Client currently taking OC's and will continue.  Routine preventative health maintenance measures emphasized. Advised client to RTC for any problems. Patient given opportunity to ask questions. All questions answered and patient voices understanding and agrees with plan.      Assessment:    39 y.o. female here with c/o vaginal d/c and tender area of redness to the left labia. Vaginal irritation, possibly yeast, thick white vaginal d/c. Remainder of exam is normal.     Plan:    Will treat labial irritation with Mycolog Cream, swab obtained for cultures. IUD removed  without difficulty. Discussed with the patient clinical findings and plan of care. She voices understanding and agrees with plan. She will return for her routine exam or sooner for any GYN problems. Form completed for work.

## 2020-10-12 ENCOUNTER — Other Ambulatory Visit: Payer: Self-pay | Admitting: *Deleted

## 2020-10-12 ENCOUNTER — Encounter: Payer: Self-pay | Admitting: Nurse Practitioner

## 2020-10-12 DIAGNOSIS — Z30432 Encounter for removal of intrauterine contraceptive device: Secondary | ICD-10-CM

## 2020-10-12 LAB — CERVICOVAGINAL ANCILLARY ONLY
Bacterial Vaginitis (gardnerella): NEGATIVE
Candida Glabrata: NEGATIVE
Candida Vaginitis: NEGATIVE
Comment: NEGATIVE
Comment: NEGATIVE
Comment: NEGATIVE

## 2020-10-12 MED ORDER — NORETHIN ACE-ETH ESTRAD-FE 1-20 MG-MCG PO TABS: 1.0000 | ORAL_TABLET | Freq: Every day | ORAL | 12 refills | Status: DC

## 2020-10-12 MED ORDER — NYSTATIN-TRIAMCINOLONE 100000-0.1 UNIT/GM-% EX CREA: 1.0000 "application " | TOPICAL_CREAM | Freq: Two times a day (BID) | CUTANEOUS | 0 refills | Status: DC

## 2021-05-18 ENCOUNTER — Encounter: Payer: Self-pay | Admitting: Radiology

## 2021-10-20 ENCOUNTER — Other Ambulatory Visit: Payer: Self-pay | Admitting: Obstetrics & Gynecology

## 2021-10-20 DIAGNOSIS — Z30432 Encounter for removal of intrauterine contraceptive device: Secondary | ICD-10-CM

## 2022-12-13 ENCOUNTER — Other Ambulatory Visit: Payer: Self-pay | Admitting: Obstetrics & Gynecology

## 2022-12-13 DIAGNOSIS — Z30432 Encounter for removal of intrauterine contraceptive device: Secondary | ICD-10-CM

## 2022-12-23 ENCOUNTER — Other Ambulatory Visit: Payer: Self-pay

## 2022-12-23 DIAGNOSIS — Z1231 Encounter for screening mammogram for malignant neoplasm of breast: Secondary | ICD-10-CM

## 2023-04-06 ENCOUNTER — Ambulatory Visit
Admission: RE | Admit: 2023-04-06 | Discharge: 2023-04-06 | Disposition: A | Discharge status: Home / Self Care | Payer: BC Managed Care – PPO | Source: Ambulatory Visit | Attending: Student | Admitting: Student

## 2023-04-06 ENCOUNTER — Other Ambulatory Visit: Payer: Self-pay | Admitting: Student

## 2023-04-06 DIAGNOSIS — Z1231 Encounter for screening mammogram for malignant neoplasm of breast: Secondary | ICD-10-CM | POA: Diagnosis present

## 2023-04-07 ENCOUNTER — Inpatient Hospital Stay
Admission: RE | Admit: 2023-04-07 | Discharge: 2023-04-07 | Disposition: A | Discharge status: Home / Self Care | Payer: Self-pay | Source: Ambulatory Visit | Attending: Family Medicine | Admitting: Family Medicine

## 2023-04-07 ENCOUNTER — Other Ambulatory Visit: Payer: Self-pay | Admitting: *Deleted

## 2023-04-07 ENCOUNTER — Encounter: Payer: Self-pay | Admitting: Obstetrics and Gynecology

## 2023-04-07 DIAGNOSIS — Z1231 Encounter for screening mammogram for malignant neoplasm of breast: Secondary | ICD-10-CM

## 2023-04-11 ENCOUNTER — Other Ambulatory Visit: Payer: Self-pay | Admitting: Student

## 2023-04-11 DIAGNOSIS — R928 Other abnormal and inconclusive findings on diagnostic imaging of breast: Secondary | ICD-10-CM

## 2023-04-11 DIAGNOSIS — N6489 Other specified disorders of breast: Secondary | ICD-10-CM

## 2023-04-13 ENCOUNTER — Ambulatory Visit
Admission: RE | Admit: 2023-04-13 | Discharge: 2023-04-13 | Disposition: A | Discharge status: Home / Self Care | Payer: BC Managed Care – PPO | Source: Ambulatory Visit | Attending: Student | Admitting: Student

## 2023-04-13 DIAGNOSIS — R928 Other abnormal and inconclusive findings on diagnostic imaging of breast: Secondary | ICD-10-CM | POA: Insufficient documentation

## 2023-04-13 DIAGNOSIS — N6489 Other specified disorders of breast: Secondary | ICD-10-CM | POA: Diagnosis present

## 2023-04-19 ENCOUNTER — Ambulatory Visit: Admission: RE | Admit: 2023-04-19 | Payer: BC Managed Care – PPO | Source: Ambulatory Visit

## 2023-04-19 ENCOUNTER — Other Ambulatory Visit: Payer: Self-pay | Admitting: Student

## 2023-04-19 ENCOUNTER — Ambulatory Visit
Admission: RE | Admit: 2023-04-19 | Discharge: 2023-04-19 | Disposition: A | Discharge status: Home / Self Care | Payer: BC Managed Care – PPO | Source: Ambulatory Visit | Attending: Student | Admitting: Student

## 2023-04-19 DIAGNOSIS — R928 Other abnormal and inconclusive findings on diagnostic imaging of breast: Secondary | ICD-10-CM

## 2023-04-19 DIAGNOSIS — N6489 Other specified disorders of breast: Secondary | ICD-10-CM | POA: Diagnosis present

## 2023-04-26 ENCOUNTER — Ambulatory Visit (INDEPENDENT_AMBULATORY_CARE_PROVIDER_SITE_OTHER): Payer: BC Managed Care – PPO | Admitting: Obstetrics and Gynecology

## 2023-04-26 ENCOUNTER — Other Ambulatory Visit (HOSPITAL_COMMUNITY)
Admission: RE | Admit: 2023-04-26 | Discharge: 2023-04-26 | Disposition: A | Discharge status: Home / Self Care | Payer: BC Managed Care – PPO | Source: Ambulatory Visit | Attending: Obstetrics and Gynecology | Admitting: Obstetrics and Gynecology

## 2023-04-26 ENCOUNTER — Encounter: Payer: Self-pay | Admitting: Obstetrics and Gynecology

## 2023-04-26 VITALS — BP 114/68 | HR 74 | Ht 61.5 in | Wt 131.0 lb

## 2023-04-26 DIAGNOSIS — Z3041 Encounter for surveillance of contraceptive pills: Secondary | ICD-10-CM

## 2023-04-26 DIAGNOSIS — Z01419 Encounter for gynecological examination (general) (routine) without abnormal findings: Secondary | ICD-10-CM | POA: Diagnosis not present

## 2023-04-26 DIAGNOSIS — Z124 Encounter for screening for malignant neoplasm of cervix: Secondary | ICD-10-CM | POA: Insufficient documentation

## 2023-04-26 DIAGNOSIS — Z113 Encounter for screening for infections with a predominantly sexual mode of transmission: Secondary | ICD-10-CM | POA: Diagnosis present

## 2023-04-26 NOTE — Progress Notes (Signed)
Patient presents for Annual.  LMP: 4.6.24 Last pap: Date: 2019, WNL:  Contraception: OCP  Mammogram: Up to date: 4.18.24 mammo, U/S 4.24.24  STD Screening: Accepts Flu Vaccine : N/A  CC:  Annual

## 2023-04-26 NOTE — Progress Notes (Signed)
Obstetrics and Gynecology New Patient Evaluation  Appointment Date: 04/26/2023  OBGYN Clinic: [facility]  Primary Care Provider: Jerl Mina  Chief Complaint:  Chief Complaint  Patient presents with   Gynecologic Exam    History of Present Illness: Natasha Adams is a 42 y.o. [id number] (Patient's last menstrual period was 04/01/2023 (approximate).), seen for the above chief complaint.  Patient had recent mammogram and ultrasound during routine breast cancer screening. No biopsies taken and recommendation for one year repeat.   Patient continues to note off and on but persistent hypersensitivity at the left labia minora/posterior fourchette/introitus area.  Review of Systems: A comprehensive review of systems was negative.   Past Medical History:  Past Medical History:  Diagnosis Date   Abnormal uterine bleeding (AUB) 07/02/2020   Hx of breast implants, bilateral 04/04/2018   Rheumatoid arthritis (HCC)     Past Surgical History:  Past Surgical History:  Procedure Laterality Date   ANKLE FRACTURE SURGERY Right 2013   BREAST ENHANCEMENT SURGERY  2017   DILATION AND CURETTAGE OF UTERUS     HAND SURGERY      Past Obstetrical History:  OB History  Gravida Para Term Preterm AB Living  3 2 2   1 2   SAB IAB Ectopic Multiple Live Births    1     2    # Outcome Date GA Lbr Len/2nd Weight Sex Delivery Anes PTL Lv  3 IAB 2017 [redacted]w[redacted]d         2 Term 05/16/10 [redacted]w[redacted]d  6 lb 12 oz (3.062 kg) F    LIV  1 Term 01/12/07 [redacted]w[redacted]d  5 lb 4 oz (2.381 kg) F Vag-Spont   LIV    Past Gynecological History: As per HPI. Periods: qmonth, regular, approximately one week, was somewhat heavy last month but not particularly heavy or painful  History of Pap Smear(s): Yes.   Last pap 2019, which was cytology and HPV negative She is currently using oral contraceptives (estrogen/progesterone) for contraception.   Social History:  Social History   Socioeconomic History    Marital status: Single    Spouse name: Not on file   Number of children: Not on file   Years of education: Not on file   Highest education level: Not on file  Occupational History   Occupation: Physical therapist  Tobacco Use   Smoking status: Never   Smokeless tobacco: Never  Vaping Use   Vaping Use: Never used  Substance and Sexual Activity   Alcohol use: Yes    Comment: weekends   Drug use: No   Sexual activity: Yes    Partners: Male    Birth control/protection: Pill  Other Topics Concern   Not on file  Social History Narrative   Not on file   Social Determinants of Health   Financial Resource Strain: Not on file  Food Insecurity: Not on file  Transportation Needs: Not on file  Physical Activity: Not on file  Stress: Not on file  Social Connections: Not on file  Intimate Partner Violence: Not on file    Family History:  She denies any female cancers  Medications Current Outpatient Medications  Medication Sig Dispense Refill   BLISOVI FE 1/20 1-20 MG-MCG tablet TAKE 1 TABLET BY MOUTH DAILY 84 tablet 4   No current facility-administered medications for this visit.    Allergies Ibuprofen and Sulfa antibiotics  Physical Exam:  BP 114/68   Pulse 74   Ht 5' 1.5" (1.562  m)   Wt 131 lb (59.4 kg)   LMP 04/01/2023 (Approximate)   BMI 24.35 kg/m  Body mass index is 24.35 kg/m. General appearance: Well nourished, well developed female in no acute distress.  Neck:  Supple, normal appearance, and no thyromegaly  Cardiovascular: normal s1 and s2.  No murmurs, rubs or gallops. Respiratory:  Clear to auscultation bilateral. Normal respiratory effort Abdomen: positive bowel sounds and no masses, hernias; diffusely non tender to palpation, non distended Breasts: bilateral implants, no palpable abnormalities otherwise. Neuro/Psych:  Normal mood and affect.  Skin:  Warm and dry.  Lymphatic:  No inguinal lymphadenopathy.   Cervical exam performed in the presence of a  chaperone Pelvic exam: is not limited by body habitus EGBUS: within normal limits. Patient points to area at the junction of the left labia majora and the posterior fourchette/introitus as the area of occasional sensitivity. Normal appearing, nttp but the area is at the crease and ?appears to be thinner vs the right side Vagina: within normal limits and with no blood or discharge in the vault Cervix: normal appearing cervix without tenderness, discharge or lesions. Uterus:  nonenlarged and non tender Adnexa:  normal adnexa and no mass, fullness, tenderness Rectovaginal: deferred  Laboratory: none  Radiology: none  Assessment: patient doing well  Plan:  1. Screen for STD (sexually transmitted disease) - Cytology - PAP - RPR+HBsAg+HCVAb+...  2. Cervical cancer screening - Cytology - PAP - RPR+HBsAg+HCVAb+...  3. Encounter for surveillance of contraceptive pills Patient using combined OCPs for contraception and not period control. D/w pt re: increased VTE, etc risk based purely on age but okay to continue. I also d/w her potential increased breast cancer risk with OCPs, but that she is at overall low risk for breast cancer given negative family history. I d/w her other contraceptive options and she would like to continue with the combined OCPs for now.   4. Encounter for well woman exam with routine gynecological exam Recommended dermatology referral for thinning hair  RTC PRN  No follow-ups on file.  No future appointments.  Cornelia Copa MD Attending [facility])

## 2023-04-27 LAB — RPR+HBSAG+HCVAB+...
HIV Screen 4th Generation wRfx: NONREACTIVE
Hep C Virus Ab: NONREACTIVE
Hepatitis B Surface Ag: NEGATIVE
RPR Ser Ql: NONREACTIVE

## 2023-04-29 LAB — CYTOLOGY - PAP
Chlamydia: NEGATIVE
Comment: NEGATIVE
Comment: NEGATIVE
Comment: NEGATIVE
Comment: NORMAL
Diagnosis: NEGATIVE
Diagnosis: REACTIVE
High risk HPV: NEGATIVE
Neisseria Gonorrhea: NEGATIVE
Trichomonas: NEGATIVE

## 2023-05-05 ENCOUNTER — Other Ambulatory Visit: Payer: Self-pay

## 2023-05-05 DIAGNOSIS — Z30432 Encounter for removal of intrauterine contraceptive device: Secondary | ICD-10-CM

## 2023-05-05 MED ORDER — NORETHIN ACE-ETH ESTRAD-FE 1-20 MG-MCG PO TABS
1.0000 | ORAL_TABLET | Freq: Every day | ORAL | 4 refills | Status: DC
Start: 2023-05-05 — End: 2024-06-26

## 2023-05-05 NOTE — Progress Notes (Signed)
Rx needed to be sent to new pharmacy

## 2024-02-28 ENCOUNTER — Other Ambulatory Visit: Payer: Self-pay | Admitting: Family Medicine

## 2024-02-28 DIAGNOSIS — Z1231 Encounter for screening mammogram for malignant neoplasm of breast: Secondary | ICD-10-CM

## 2024-04-15 ENCOUNTER — Ambulatory Visit
Admission: RE | Admit: 2024-04-15 | Discharge: 2024-04-15 | Disposition: A | Discharge status: Home / Self Care | Source: Ambulatory Visit | Attending: Family Medicine | Admitting: Family Medicine

## 2024-04-15 DIAGNOSIS — Z1231 Encounter for screening mammogram for malignant neoplasm of breast: Secondary | ICD-10-CM | POA: Insufficient documentation

## 2024-06-23 ENCOUNTER — Other Ambulatory Visit: Payer: Self-pay | Admitting: Obstetrics and Gynecology

## 2024-06-23 DIAGNOSIS — Z30432 Encounter for removal of intrauterine contraceptive device: Secondary | ICD-10-CM

## 2024-06-26 ENCOUNTER — Encounter: Payer: Self-pay | Admitting: Obstetrics and Gynecology
